# Patient Record
Sex: Female | Born: 1995 | Race: Black or African American | Hispanic: No | Marital: Single | Smoking: Never smoker
Health system: Southern US, Community
[De-identification: ages and names within clinical notes are randomized; demographics above are authoritative.]

---

## 2016-04-27 ENCOUNTER — Encounter (HOSPITAL_COMMUNITY): Payer: Self-pay | Admitting: Emergency Medicine

## 2016-04-27 ENCOUNTER — Emergency Department (HOSPITAL_COMMUNITY)
Admission: EM | Admit: 2016-04-27 | Discharge: 2016-04-28 | Disposition: A | Discharge status: Home / Self Care | Payer: BLUE CROSS/BLUE SHIELD | Attending: Emergency Medicine | Admitting: Emergency Medicine

## 2016-04-27 DIAGNOSIS — N760 Acute vaginitis: Secondary | ICD-10-CM

## 2016-04-27 DIAGNOSIS — Z79899 Other long term (current) drug therapy: Secondary | ICD-10-CM | POA: Insufficient documentation

## 2016-04-27 DIAGNOSIS — N898 Other specified noninflammatory disorders of vagina: Secondary | ICD-10-CM | POA: Diagnosis present

## 2016-04-27 LAB — URINALYSIS, ROUTINE W REFLEX MICROSCOPIC
Bilirubin Urine: NEGATIVE
Glucose, UA: NEGATIVE mg/dL
HGB URINE DIPSTICK: NEGATIVE
KETONES UR: NEGATIVE mg/dL
Nitrite: NEGATIVE
PROTEIN: NEGATIVE mg/dL
Specific Gravity, Urine: 1.018 (ref 1.005–1.030)
pH: 7 (ref 5.0–8.0)

## 2016-04-27 LAB — POC URINE PREG, ED: PREG TEST UR: NEGATIVE

## 2016-04-27 NOTE — ED Triage Notes (Signed)
Pt. reports increasing amount of vaginal discharge this week , denies odor or dysuria , pt. suspects yeast infection , denies fever or chills .

## 2016-04-28 LAB — WET PREP, GENITAL
Clue Cells Wet Prep HPF POC: NONE SEEN
Sperm: NONE SEEN
TRICH WET PREP: NONE SEEN
YEAST WET PREP: NONE SEEN

## 2016-04-28 MED ORDER — METRONIDAZOLE 500 MG PO TABS: 500.0000 mg | ORAL_TABLET | Freq: Two times a day (BID) | ORAL | 0 refills | Status: AC

## 2016-04-28 NOTE — ED Provider Notes (Signed)
[facility]-EMERGENCY DEPT Provider Note   CSN: [id number] Arrival date & time: 04/27/16  1949   History   Chief Complaint Chief Complaint  Patient presents with  . Vaginal Discharge    HPI Alicia Hanna is a 21 y.o. female.  21 year old female presents to the emergency department for evaluation of vaginal discharge. She reports a greenish, thick discharge which has been worsening over the past few months. She has noticed increased discharge this week. She has not had any odor, dysuria, fever, abdominal pain, nausea, or vomiting. She was supposed to follow-up with her OB/GYN, but this appointment was pushed sometime in the future. She states that she was concerned about the degree of discharge so she decided to come today. She denies ever having been sexually active.     History reviewed. No pertinent past medical history.  There are no active problems to display for this patient.   History reviewed. No pertinent surgical history.  OB History    No data available       Home Medications    Prior to Admission medications   Medication Sig Start Date End Date Taking? Authorizing Provider  metroNIDAZOLE (FLAGYL) 500 MG tablet Take 1 tablet (500 mg total) by mouth 2 (two) times daily. 04/28/16   Antony Madura, PA-C    Family History No family history on file.  Social History Social History  Substance Use Topics  . Smoking status: Never Smoker  . Smokeless tobacco: Never Used  . Alcohol use No     Allergies   Patient has no known allergies.   Review of Systems Review of Systems Ten systems reviewed and are negative for acute change, except as noted in the HPI.    Physical Exam Updated Vital Signs LMP 03/02/2016 (Approximate)   Physical Exam  Constitutional: She is oriented to person, place, and time. She appears well-developed and well-nourished. No distress.  Nontoxic appearing and in NAD  HENT:  Head: Normocephalic and atraumatic.  Eyes: Conjunctivae and EOM  are normal. No scleral icterus.  Neck: Normal range of motion.  Pulmonary/Chest: Effort normal. No respiratory distress.  Respirations even and unlabored  Genitourinary:  Genitourinary Comments: External labia are unremarkable. There is a scant amount of white, mucousy discharge at the opening of the vaginal canal. No lesions or bleeding. Internal exam deferred as patient is not sexually active.  Musculoskeletal: Normal range of motion.  Neurological: She is alert and oriented to person, place, and time. She exhibits normal muscle tone. Coordination normal.  GCS 15. Patient moving all extremities.  Skin: Skin is warm and dry. No rash noted. She is not diaphoretic. No erythema. No pallor.  Psychiatric: She has a normal mood and affect. Her behavior is normal.  Nursing note and vitals reviewed.    ED Treatments / Results  Labs (all labs ordered are listed, but only abnormal results are displayed) Labs Reviewed  WET PREP, GENITAL - Abnormal; Notable for the following:       Result Value   WBC, Wet Prep HPF POC MODERATE (*)    All other components within normal limits  URINALYSIS, ROUTINE W REFLEX MICROSCOPIC - Abnormal; Notable for the following:    APPearance HAZY (*)    Leukocytes, UA SMALL (*)    Bacteria, UA RARE (*)    Squamous Epithelial / LPF 6-30 (*)    All other components within normal limits  POC URINE PREG, ED    EKG  EKG Interpretation None  Radiology No results found.  Procedures Procedures (including critical care time)  Medications Ordered in ED Medications - No data to display   Initial Impression / Assessment and Plan / ED Course  I have reviewed the triage vital signs and the nursing notes.  Pertinent labs & imaging results that were available during my care of the patient were reviewed by me and considered in my medical decision making (see chart for details).     Patient with no hx of sexual activity presents for progressive worsening of  vaginal discharge. Discharge is then and white in color. Patient reports it being more greenish in color. Patient does have WBC on wet prep. Clinically, symptoms are most c/w vaginitis. No yeast seen. Will cover with Flagyl x 7 days given extent of symptoms. Patient advised to f/u with her OBGYN. Return precautions discussed and provided. Patient discharged in satisfactory condition with no unaddressed concerns.   Final Clinical Impressions(s) / ED Diagnoses   Final diagnoses:  Acute vaginitis    New Prescriptions New Prescriptions   METRONIDAZOLE (FLAGYL) 500 MG TABLET    Take 1 tablet (500 mg total) by mouth 2 (two) times daily.     Antony MaduraKelly Teryn Boerema, PA-C 04/28/16 0109    Alvira MondayErin Schlossman, MD 04/29/16 [phone]

## 2017-07-08 ENCOUNTER — Emergency Department (HOSPITAL_COMMUNITY)
Admission: EM | Admit: 2017-07-08 | Discharge: 2017-07-09 | Disposition: A | Discharge status: Home / Self Care | Payer: BLUE CROSS/BLUE SHIELD | Attending: Emergency Medicine | Admitting: Emergency Medicine

## 2017-07-08 DIAGNOSIS — M25561 Pain in right knee: Secondary | ICD-10-CM | POA: Insufficient documentation

## 2017-07-09 ENCOUNTER — Emergency Department (HOSPITAL_COMMUNITY): Payer: BLUE CROSS/BLUE SHIELD

## 2017-07-09 ENCOUNTER — Encounter (HOSPITAL_COMMUNITY): Payer: Self-pay

## 2017-07-09 MED ORDER — IBUPROFEN 800 MG PO TABS: 800.0000 mg | ORAL_TABLET | Freq: Three times a day (TID) | ORAL | 0 refills | Status: AC

## 2017-07-09 NOTE — ED Provider Notes (Signed)
[facility] EMERGENCY DEPARTMENT Provider Note   CSN: [id number] Arrival date & time: 07/08/17  2344     History   Chief Complaint Chief Complaint  Patient presents with  . Knee Pain    HPI Alicia Hanna is a 22 y.o. female.  Patient presents to the emergency department with a chief complaint of right knee pain.  She states that she tripped and fell about 3 weeks ago on the right knee.  States that she has had persistent pain, and reports that the knee gives out and she also has some clicking and popping of the knee.  She states that her mother was concerned about a blood clot and sent her to the emergency department.  She denies any calf pain or swelling.  Denies any CP or SOB.  The history is provided by the patient. No language interpreter was used.    History reviewed. No pertinent past medical history.  There are no active problems to display for this patient.   History reviewed. No pertinent surgical history.   OB History   None      Home Medications    Prior to Admission medications   Medication Sig Start Date End Date Taking? Authorizing Provider  metroNIDAZOLE (FLAGYL) 500 MG tablet Take 1 tablet (500 mg total) by mouth 2 (two) times daily. 04/28/16   Antony Madura, PA-C    Family History No family history on file.  Social History Social History   Tobacco Use  . Smoking status: Never Smoker  . Smokeless tobacco: Never Used  Substance Use Topics  . Alcohol use: No  . Drug use: No     Allergies   Patient has no known allergies.   Review of Systems Review of Systems  All other systems reviewed and are negative.    Physical Exam Updated Vital Signs BP (!) 105/59 (BP Location: Right Arm)   Pulse 87   Temp 98.4 F (36.9 C) (Oral)   Resp 16   LMP 06/14/2017   SpO2 98%   Physical Exam Nursing note and vitals reviewed.  Constitutional: Pt appears well-developed and well-nourished. No distress.  HENT:  Head:  Normocephalic and atraumatic.  Eyes: Conjunctivae are normal.  Neck: Normal range of motion.  Cardiovascular: Normal rate, regular rhythm. Intact distal pulses.   Capillary refill < 3 sec.  Pulmonary/Chest: Effort normal and breath sounds normal.  Musculoskeletal:  RLE Pt exhibits minor abrasion to right knee, healing well, no sign of infection, no visible FB, joint stability testing without notable deficit.   ROM: 4/5  Strength: 5/5  Neurological: Pt  is alert. Coordination normal.  Sensation: 5/5 Skin: Skin is warm and dry. Pt is not diaphoretic.  No evidence of open wound or skin tenting Psychiatric: Pt has a normal mood and affect.     ED Treatments / Results  Labs (all labs ordered are listed, but only abnormal results are displayed) Labs Reviewed - No data to display  EKG None  Radiology Dg Knee Complete 4 Views Right  Result Date: 07/09/2017 CLINICAL DATA:  Right knee pain.  Fall with abrasion. EXAM: RIGHT KNEE - COMPLETE 4+ VIEW COMPARISON:  None. FINDINGS: No evidence of fracture, dislocation, or joint effusion. No evidence of arthropathy or other focal bone abnormality. Tiny punctate density in the soft tissues about the medial knee at the level of the tibial plateau. IMPRESSION: 1. No osseous abnormality or joint effusion. 2. Punctate density in the medial soft tissues may be tiny foreign  body. Electronically Signed   By: Rubye OaksMelanie  Ehinger M.D.   On: 07/09/2017 01:48    Procedures Procedures (including critical care time)  Medications Ordered in ED Medications - No data to display   Initial Impression / Assessment and Plan / ED Course  I have reviewed the triage vital signs and the nursing notes.  Pertinent labs & imaging results that were available during my care of the patient were reviewed by me and considered in my medical decision making (see chart for details).     Patient presents with injury to right knee.  DDx includes, fracture, strain, or  sprain.  Plain films reveal no osseous abnormality or effusion.  Pt advised to follow up with PCP and/or orthopedics. Patient given crutches and knee immobilizer while in ED, conservative therapy such as RICE recommended and discussed.   Patient will be discharged home & is agreeable with above plan. Returns precautions discussed. Pt appears safe for discharge.   Final Clinical Impressions(s) / ED Diagnoses   Final diagnoses:  Right knee pain, unspecified chronicity    ED Discharge Orders        Ordered    ibuprofen (ADVIL,MOTRIN) 800 MG tablet  3 times daily     07/09/17 0541       Roxy HorsemanBrowning, Domingo Fuson, PA-C 07/09/17 0541    Geoffery Lyonselo, [place], MD 07/09/17 [phone]

## 2017-07-09 NOTE — ED Triage Notes (Signed)
Pt states that she was running last week and fell on her R knee and it still hurts, no swelling noted,  abrasion

## 2017-07-09 NOTE — Progress Notes (Signed)
Orthopedic Tech Progress Note Patient Details:  Freeman CaldronCorrina Lagrow 03/29/1996 [id number]  Ortho Devices Type of Ortho Device: Knee Immobilizer, Crutches Ortho Device/Splint Location: rle Ortho Device/Splint Interventions: Ordered   Post Interventions Patient Tolerated: Well Instructions Provided: Adjustment of device   Trinna PostMartinez, Aundre Hietala J 07/09/2017, 6:09 AM

## 2018-10-23 IMAGING — DX DG KNEE COMPLETE 4+V*R*
4 series · 4 of 4 positions shown · non-contrast
Comparison: None.

CLINICAL DATA: Right knee pain.  Fall with abrasion.

EXAM:
RIGHT KNEE - COMPLETE 4+ VIEW

[knee ap]
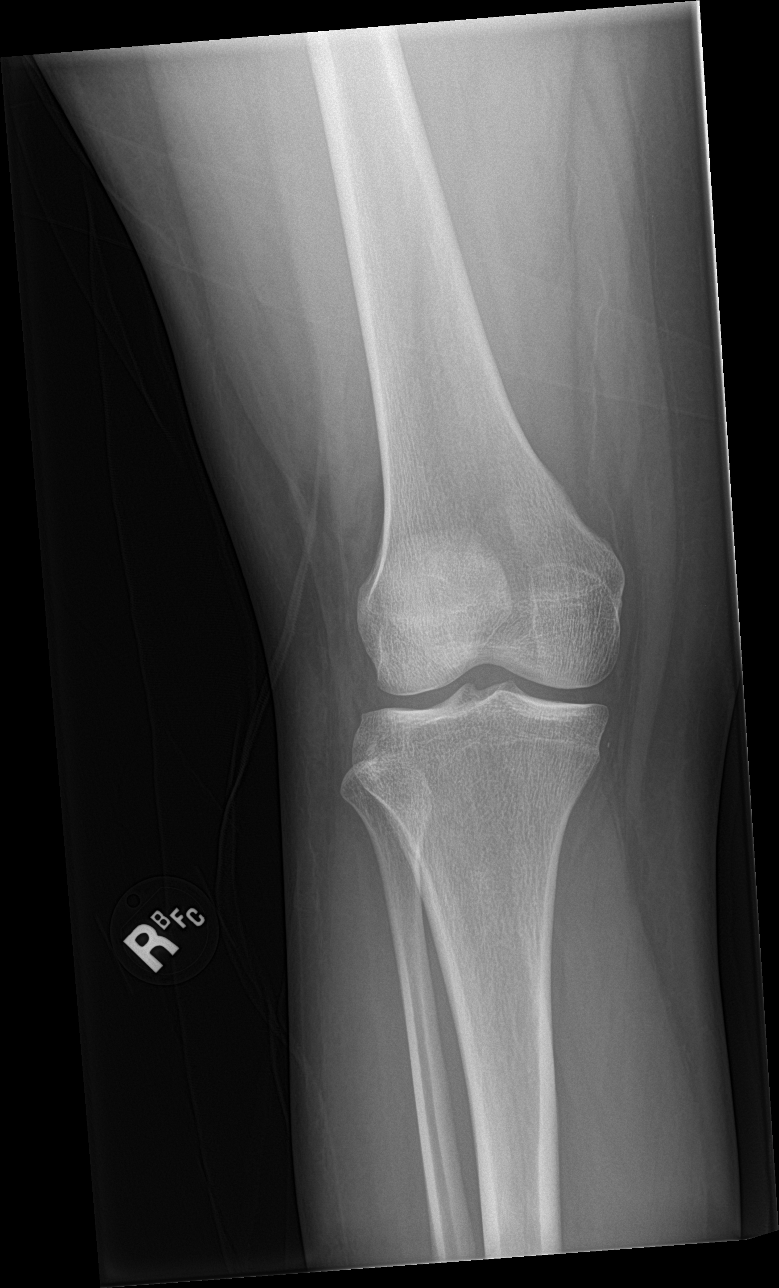

[knee lat]
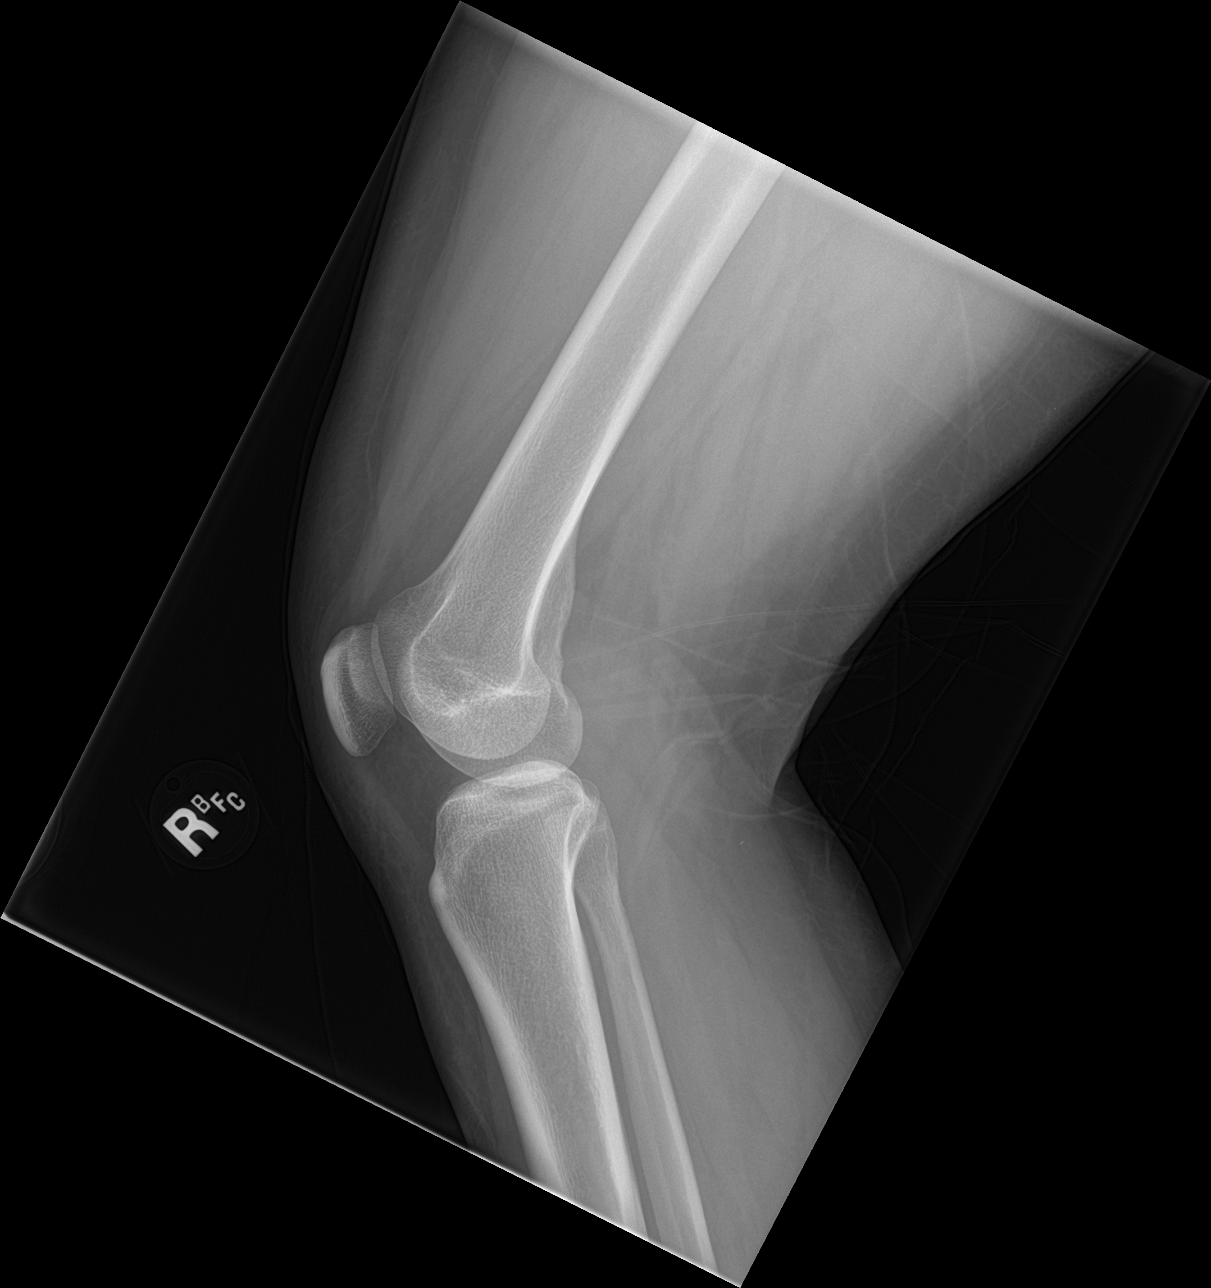

[knee obl (1 of 2)]
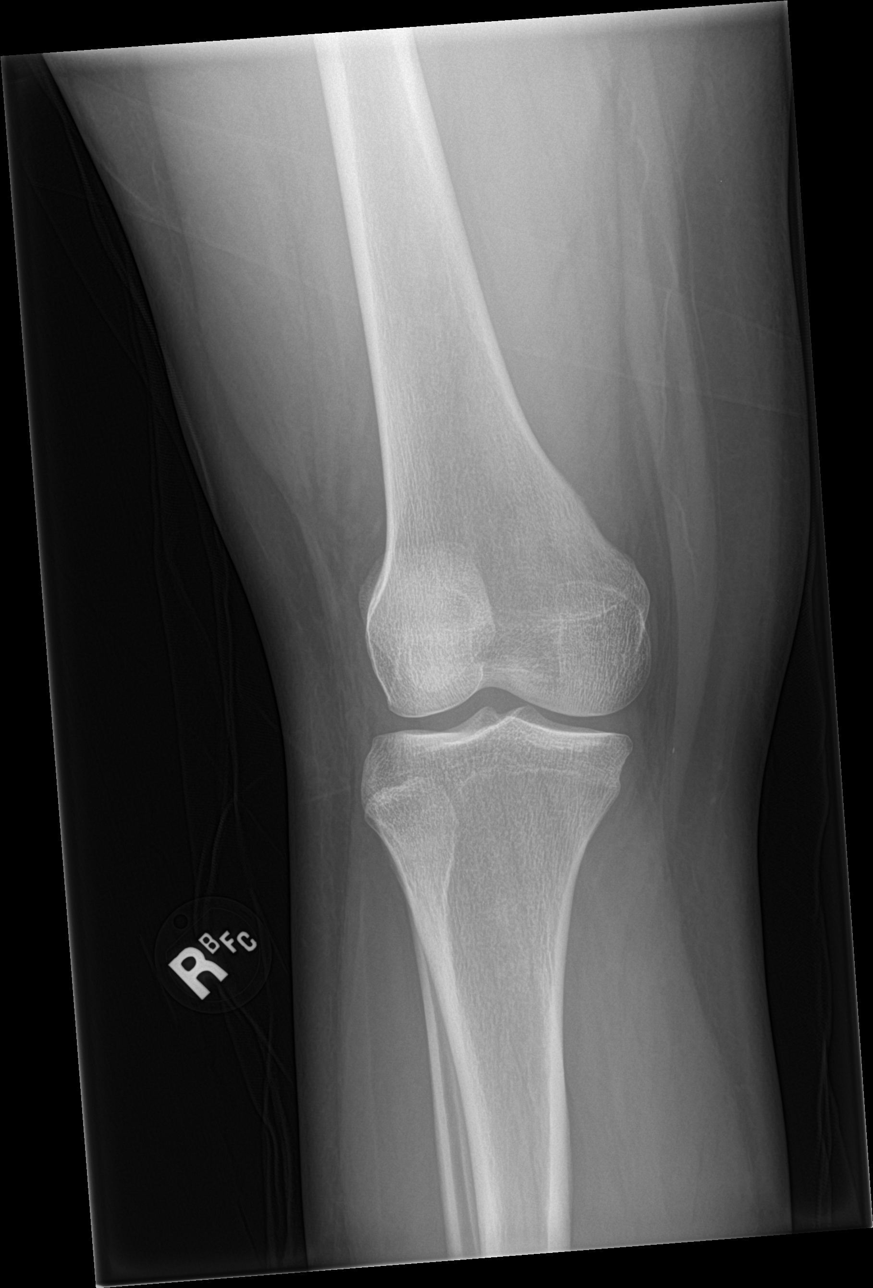

[knee obl (2 of 2)]
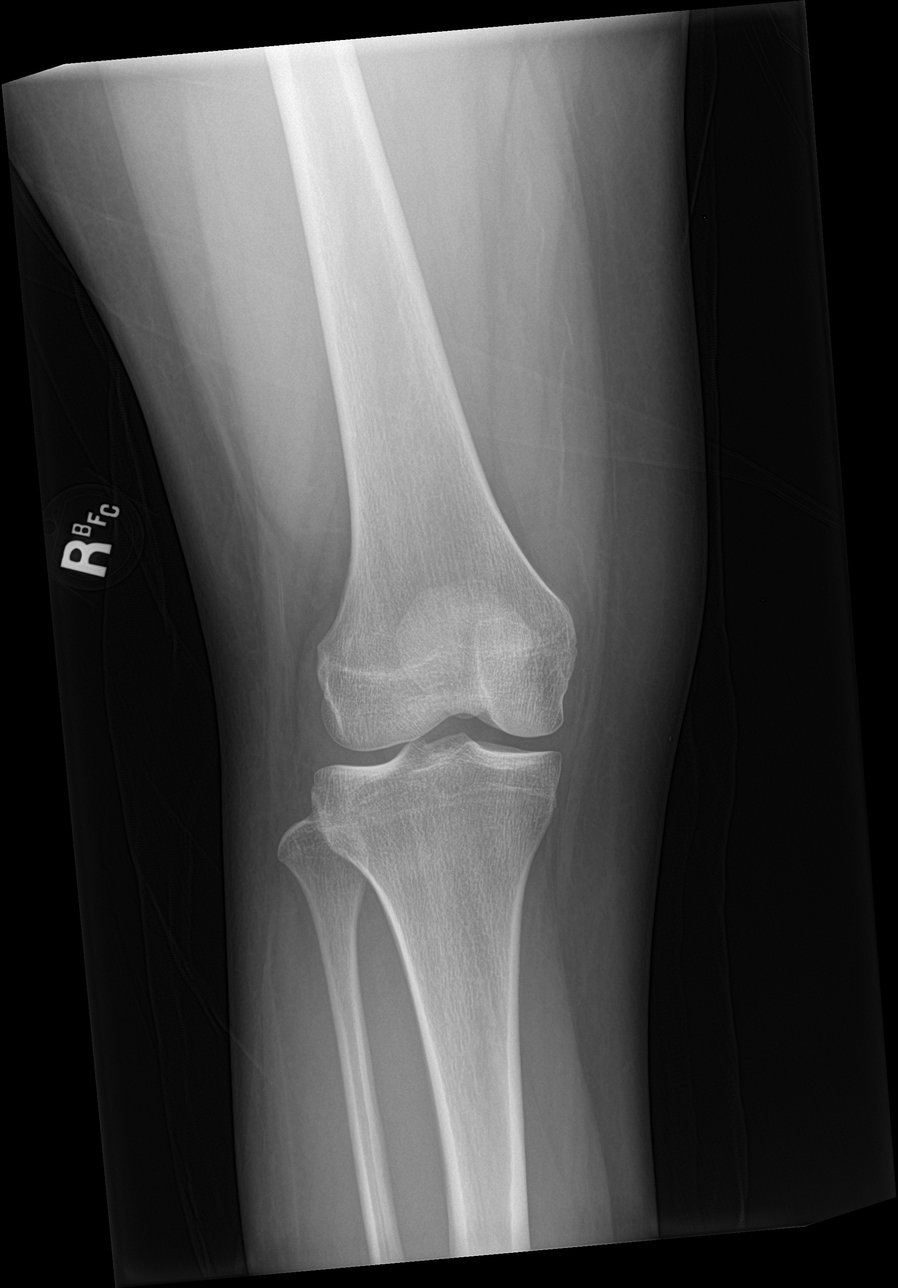

[4 of 4 positions shown; findings below may reference images not displayed]

FINDINGS: No evidence of fracture, dislocation, or joint effusion. No evidence
of arthropathy or other focal bone abnormality. Tiny punctate
density in the soft tissues about the medial knee at the level of
the tibial plateau..
IMPRESSION: 1. No osseous abnormality or joint effusion.
2. Punctate density in the medial soft tissues may be tiny foreign
body.
